# Patient Record
Sex: Female | Born: 1975 | Race: White | Hispanic: No | Marital: Single
Health system: Southern US, Community
[De-identification: ages and names within clinical notes are randomized; demographics above are authoritative.]

---

## 2012-03-29 ENCOUNTER — Inpatient Hospital Stay: Payer: Self-pay | Admitting: Internal Medicine

## 2012-03-29 LAB — CBC WITH DIFFERENTIAL/PLATELET
Basophil #: 0 10*3/uL (ref 0.0–0.1)
Basophil %: 0.2 %
Eosinophil #: 0 10*3/uL (ref 0.0–0.7)
HCT: 34.4 % — ABNORMAL LOW (ref 35.0–47.0)
HGB: 11.8 g/dL — ABNORMAL LOW (ref 12.0–16.0)
Lymphocyte #: 1.7 10*3/uL (ref 1.0–3.6)
Lymphocyte %: 15 %
MCH: 31.8 pg (ref 26.0–34.0)
MCV: 93 fL (ref 80–100)
Monocyte %: 7.2 %
Platelet: 226 10*3/uL (ref 150–440)
RBC: 3.7 10*6/uL — ABNORMAL LOW (ref 3.80–5.20)
RDW: 13.6 % (ref 11.5–14.5)
WBC: 11.4 10*3/uL — ABNORMAL HIGH (ref 3.6–11.0)

## 2013-09-03 ENCOUNTER — Emergency Department: Payer: Self-pay | Admitting: Emergency Medicine
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
## 2015-04-09 NOTE — Op Note (Signed)
PATIENT NAME:  Terri Bush, [name] MR#:  [id number] DATE OF BIRTH:  [birth year]  DATE OF PROCEDURE:  03/30/2012  PREOPERATIVE DIAGNOSIS:  Term intrauterine pregnancy, failure to progress secondary to arrest of descent with a component of cephalopelvic disproportion.   POSTOPERATIVE DIAGNOSIS:  Term intrauterine pregnancy, failure to progress secondary to arrest of descent with a component of cephalopelvic disproportion.   PROCEDURE: Low transverse cesarean section.   SURGEON: Annamarie MajorPaul Gabriel Conry, M.D.   ASSISTANT: [facility].   ANESTHESIA: Spinal.   ESTIMATED BLOOD LOSS: 250 mL.   COMPLICATIONS: None.   FINDINGS: The patient had several small fibroids towards the fundus of her uterus. Otherwise that was normal as well as her tubes and ovaries. The patient delivered a viable female infant weighing 8 pounds, 12 ounces with Apgar scores of 9 and 9 at one and five minutes respectively.   DISPOSITION: To the recovery room in stable condition.   TECHNIQUE: The patient is prepped and draped in the usual sterile fashion after adequate anesthesia is obtained in the supine position on the operating room table. A scalpel is used to create a low transverse skin incision which is then carried out to the level of the rectus fascia. This is then dissected bilaterally using Mayo scissors and the rectus muscles are separated in the midline with penetration of the peritoneum and dissection of the bladder and retracted in an inferior fashion. A scalpel was used to create a low transverse hysterotomy incision that is then extended by blunt dissection. Amniotomy reveals meconium. The infant's head is grasped and delivered and then the remaining portion of the infant is delivered without suctioning and minimal stimulation. The umbilical cord is clamped and cut and the infant handed to the pediatric team and is noted to be crying even vigorously upon this transition.   Cord blood is obtained and the placenta is  manually extracted. The uterus is externalized and cleansed of all membranes and debris using a moist sponge. The hysterotomy incision is closed with a running #1 Vicryl suture in a locking fashion followed by a second layer to imbricate the first layer. Careful observation for the bladder plane is observed to avoid any suturing of the bladder into the lower uterine segment. Interceed is placed over this the incisional area to minimize adhesion formation after hemostasis has had been assured. The uterus is placed back in the intra-abdominal cavity and the paracolic gutters are irrigated with warm saline.   The peritoneum is closed with 0 Vicryl suture. The On-Q pain pump is then placed with trocars placed through the abdomen into the subfascial space and the catheters are fed into place. The fascia is then closed with 0 Maxon suture followed by irrigation of the subcutaneous tissues with hemostasis assured using electrocautery. Skin is closed with 4-0 Vicryl suture in a subcuticular fashion followed by placement of Dermabond over the incision as well as to stabilize the On-Q pain catheters in place. Steri-Strips and a Tegaderm is also placed over the On-Q pain pump exit sites. The patient goes to the recovery room in stable condition having tolerated the procedure well. All sponge, instrument, and needle counts are correct.    ____________________________ R. Annamarie MajorPaul Kalysta Kneisley, MD [email or web address] D: 03/30/2012 16:26:49 ET T: 03/30/2012 17:38:43 ET JOB#: [id number]  cc: Dierdre Searles. Paul Ryan Palermo, MD, <Dictator> Nadara MustardOBERT P Jaydin Boniface MD ELECTRONICALLY SIGNED 04/02/2012 18:21

## 2015-04-25 NOTE — H&P (Signed)
L&D Evaluation:  History:   HPI 39 yo G1P0 @ 40.2 wks EDC 03/27/12 by 7wk uls presents with contractions.  She has been contracting since yesterday and for the last hour before calling, were q 4-5 min.  Cervix was 1-2 cm on arrival.  Re-exam an hour later revealed unchanged exam.  She cont to have painful ctxs and cervix was found to be changed to 3cm.  PNC @ [facility] uncomplicated.  No LOF, VB.  +FM.  GBS neg.    Presents with contractions    Patient's Medical History No Chronic Illness    Patient's Surgical History Tonsillectomy    Allergies Claritin    Social History Former smoker - quit 8 yrs ago    Family History HTN   ROS:   ROS see HPI, all others reviewed and neg   Exam:   Vital Signs stable    Urine Protein not completed    General no apparent distress, Painful ctxs    Mental Status clear    Chest clear    Heart normal sinus rhythm, no murmur/gallop/rubs    Abdomen gravid, tender with contractions    Estimated Fetal Weight Average for gestational age    Edema 1+    Pelvic Cvx 4/100/-2    Mebranes Ruptured, AROM    Description green/meconium    FHT normal rate with no decels    Fetal Heart Rate 145    Ucx regular, q 4 min    Skin dry   Impression:   Impression early labor   Plan:   Plan monitor contractions and for cervical change, IV analgesia prn   Electronic Signatures: Senaida LangeWeaver-Lee, Terri Bush (MD)  (Signed 14-Apr-13 19:17)  Authored: L&D Evaluation   Last Updated: 14-Apr-13 19:17 by Senaida LangeWeaver-Lee, Augustino Savastano (MD)

## 2019-12-27 ENCOUNTER — Ambulatory Visit: Payer: Self-pay | Attending: Internal Medicine

## 2019-12-27 DIAGNOSIS — Z20822 Contact with and (suspected) exposure to covid-19: Secondary | ICD-10-CM | POA: Insufficient documentation

## 2019-12-28 LAB — NOVEL CORONAVIRUS, NAA: SARS-CoV-2, NAA: NOT DETECTED
